# Patient Record
Sex: Male | Born: 1975 | Race: White | Hispanic: No | Marital: Married
Health system: Southern US, Community
[De-identification: ages and names within clinical notes are randomized; demographics above are authoritative.]

---

## 2007-09-05 ENCOUNTER — Ambulatory Visit: Payer: Self-pay

## 2007-10-19 ENCOUNTER — Ambulatory Visit: Payer: Self-pay | Admitting: Orthopaedic Surgery

## 2007-12-06 ENCOUNTER — Emergency Department: Payer: Self-pay | Admitting: Emergency Medicine
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
## 2010-08-10 ENCOUNTER — Emergency Department: Payer: Self-pay | Admitting: Emergency Medicine

## 2011-10-19 ENCOUNTER — Ambulatory Visit: Payer: Self-pay | Admitting: Internal Medicine

## 2011-10-19 ENCOUNTER — Other Ambulatory Visit: Payer: Self-pay | Admitting: Ophthalmology

## 2012-07-14 ENCOUNTER — Emergency Department: Payer: Self-pay | Admitting: Emergency Medicine

## 2017-04-08 ENCOUNTER — Telehealth: Payer: Self-pay | Admitting: Family Medicine

## 2017-04-08 NOTE — Telephone Encounter (Signed)
Maurine Ministerennis ok'ed to see this paerson as a new patient.  Thanks Fortune Brandsteri

## 2021-05-28 DIAGNOSIS — Z131 Encounter for screening for diabetes mellitus: Secondary | ICD-10-CM | POA: Diagnosis not present

## 2021-05-28 DIAGNOSIS — G4733 Obstructive sleep apnea (adult) (pediatric): Secondary | ICD-10-CM | POA: Diagnosis not present

## 2021-05-28 DIAGNOSIS — R5383 Other fatigue: Secondary | ICD-10-CM | POA: Diagnosis not present

## 2021-05-28 DIAGNOSIS — Z1211 Encounter for screening for malignant neoplasm of colon: Secondary | ICD-10-CM | POA: Diagnosis not present

## 2021-05-28 DIAGNOSIS — N1832 Chronic kidney disease, stage 3b: Secondary | ICD-10-CM | POA: Diagnosis not present

## 2021-06-17 DIAGNOSIS — Z1211 Encounter for screening for malignant neoplasm of colon: Secondary | ICD-10-CM | POA: Diagnosis not present

## 2021-07-19 ENCOUNTER — Telehealth: Payer: BC Managed Care – PPO | Admitting: Emergency Medicine

## 2021-07-19 DIAGNOSIS — U071 COVID-19: Secondary | ICD-10-CM | POA: Diagnosis not present

## 2021-07-19 NOTE — Patient Instructions (Addendum)
Francis Johnson, thank you for joining Cathlyn Parsons, NP for today's virtual visit.  While this provider is not your primary care provider (PCP), if your PCP is located in our provider database this encounter information will be shared with them immediately following your visit.  Consent: (Patient) Francis Johnson provided verbal consent for this virtual visit at the beginning of the encounter.  Current Medications: No current outpatient medications on file.   Medications ordered in this encounter:  No orders of the defined types were placed in this encounter.    *If you need refills on other medications prior to your next appointment, please contact your pharmacy*  Follow-Up: Call back or seek an in-person evaluation if the symptoms worsen or if the condition fails to improve as anticipated.  Other Instructions Please keep well-hydrated and get plenty of rest. Start a saline nasal rinse to flush out your nasal passages. You can use plain Mucinex (generic guaifenesin is fine to use) to help thin congestion. Continue using Sudafed in the morning as you have been doing.  Continue using ibuprofen for aches and fever as you have been doing. Use Flonase nasal spray, 2 sprays in each nostril once a day, as directed on the bottle.  You need to quarantine for a full 10 days from your first onset of symptoms, since you are still experiencing symptoms.  If you note any worsening of symptoms, any significant shortness of breath or any chest pain, please seek ER evaluation ASAP.  Please do not delay care!  COVID-19: What to Do if You Are Sick If you test positive and are an older adult or someone who is at high risk of getting very sick from COVID-19, treatment may be available. Contact a healthcare provider right away after a positive test to determine if you are eligible, even if your symptoms are mild right now. You can also visit a Test to Treat location and, if eligible, receive a  prescription from a provider. Don't delay: Treatment must be started within the first few days to be effective. If you have a fever, cough, or other symptoms, you might have COVID-19. Most people have mild illness and are able to recover at home. If you are sick: Keep track of your symptoms. If you have an emergency warning sign (including trouble breathing), call 911. Steps to help prevent the spread of COVID-19 if you are sick If you are sick with COVID-19 or think you might have COVID-19, follow the steps below to care for yourself and to help protect other people in your home and community. Stay home except to get medical care Stay home. Most people with COVID-19 have mild illness and can recover at home without medical care. Do not leave your home, except to get medical care. Do not visit public areas and do not go to places where you are unable to wear a mask. Take care of yourself. Get rest and stay hydrated. Take over-the-counter medicines, such as acetaminophen, to help you feel better. Stay in touch with your doctor. Call before you get medical care. Be sure to get care if you have trouble breathing, or have any other emergency warning signs, or if you think it is an emergency. Avoid public transportation, ride-sharing, or taxis if possible. Get tested If you have symptoms of COVID-19, get tested. While waiting for test results, stay away from others, including staying apart from those living in your household. Get tested as soon as possible after your symptoms start.  Treatments may be available for people with COVID-19 who are at risk for becoming very sick. Don't delay: Treatment must be started early to be effective--some treatments must begin within 5 days of your first symptoms. Contact your healthcare provider right away if your test result is positive to determine if you are eligible. Self-tests are one of several options for testing for the virus that causes COVID-19 and may be more  convenient than laboratory-based tests and point-of-care tests. Ask your healthcare provider or your local health department if you need help interpreting your test results. You can visit your state, tribal, local, and territorial health department's website to look for the latest local information on testing sites. Separate yourself from other people As much as possible, stay in a specific room and away from other people and pets in your home. If possible, you should use a separate bathroom. If you need to be around other people or animals in or outside of the home, wear a well-fitting mask. Tell your close contacts that they may have been exposed to COVID-19. An infected person can spread COVID-19 starting 48 hours (or 2 days) before the person has any symptoms or tests positive. By letting your close contacts know they may have been exposed to COVID-19, you are helping to protect everyone. See COVID-19 and Animals if you have questions about pets. If you are diagnosed with COVID-19, someone from the health department may call you. Answer the call to slow the spread. Monitor your symptoms Symptoms of COVID-19 include fever, cough, or other symptoms. Follow care instructions from your healthcare provider and local health department. Your local health authorities may give instructions on checking your symptoms and reporting information. When to seek emergency medical attention Look for emergency warning signs* for COVID-19. If someone is showing any of these signs, seek emergency medical care immediately: Trouble breathing Persistent pain or pressure in the chest New confusion Inability to wake or stay awake Pale, gray, or blue-colored skin, lips, or nail beds, depending on skin tone *This list is not all possible symptoms. Please call your medical provider for any other symptoms that are severe or concerning to you. Call 911 or call ahead to your local emergency facility: Notify the operator that  you are seeking care for someone who has or may have COVID-19. Call ahead before visiting your doctor Call ahead. Many medical visits for routine care are being postponed or done by phone or telemedicine. If you have a medical appointment that cannot be postponed, call your doctor's office, and tell them you have or may have COVID-19. This will help the office protect themselves and other patients. If you are sick, wear a well-fitting mask You should wear a mask if you must be around other people or animals, including pets (even at home). Wear a mask with the best fit, protection, and comfort for you. You don't need to wear the mask if you are alone. If you can't put on a mask (because of trouble breathing, for example), cover your coughs and sneezes in some other way. Try to stay at least 6 feet away from other people. This will help protect the people around you. Masks should not be placed on young children under age 21 years, anyone who has trouble breathing, or anyone who is not able to remove the mask without help. Cover your coughs and sneezes Cover your mouth and nose with a tissue when you cough or sneeze. Throw away used tissues in a lined trash can.  Immediately wash your hands with soap and water for at least 20 seconds. If soap and water are not available, clean your hands with an alcohol-based hand sanitizer that contains at least 60% alcohol. Clean your hands often Wash your hands often with soap and water for at least 20 seconds. This is especially important after blowing your nose, coughing, or sneezing; going to the bathroom; and before eating or preparing food. Use hand sanitizer if soap and water are not available. Use an alcohol-based hand sanitizer with at least 60% alcohol, covering all surfaces of your hands and rubbing them together until they feel dry. Soap and water are the best option, especially if hands are visibly dirty. Avoid touching your eyes, nose, and mouth with  unwashed hands. Handwashing Tips Avoid sharing personal household items Do not share dishes, drinking glasses, cups, eating utensils, towels, or bedding with other people in your home. Wash these items thoroughly after using them with soap and water or put in the dishwasher. Clean surfaces in your home regularly Clean and disinfect high-touch surfaces (for example, doorknobs, tables, handles, light switches, and countertops) in your "sick room" and bathroom. In shared spaces, you should clean and disinfect surfaces and items after each use by the person who is ill. If you are sick and cannot clean, a caregiver or other person should only clean and disinfect the area around you (such as your bedroom and bathroom) on an as needed basis. Your caregiver/other person should wait as long as possible (at least several hours) and wear a mask before entering, cleaning, and disinfecting shared spaces that you use. Clean and disinfect areas that may have blood, stool, or body fluids on them. Use household cleaners and disinfectants. Clean visible dirty surfaces with household cleaners containing soap or detergent. Then, use a household disinfectant. Use a product from [employer] List N: Disinfectants for Coronavirus (COVID-19). Be sure to follow the instructions on the label to ensure safe and effective use of the product. Many products recommend keeping the surface wet with a disinfectant for a certain period of time (look at "contact time" on the product label). You may also need to wear personal protective equipment, such as gloves, depending on the directions on the product label. Immediately after disinfecting, wash your hands with soap and water for 20 seconds. For completed guidance on cleaning and disinfecting your home, visit Complete Disinfection Guidance. Take steps to improve ventilation at home Improve ventilation (air flow) at home to help prevent from spreading COVID-19 to other people in your  household. Clear out COVID-19 virus particles in the air by opening windows, using air filters, and turning on fans in your home. Use this interactive tool to learn how to improve air flow in your home. When you can be around others after being sick with COVID-19 Deciding when you can be around others is different for different situations. Find out when you can safely end home isolation. For any additional questions about your care, contact your healthcare provider or state or local health department. 01/20/2021 Content source: [facility] ([facility]), Division of Viral Diseases This information is not intended to replace advice given to you by your health care provider. Make sure you discuss any questions you have with your health care provider. Document Revised: 03/05/2021 Document Reviewed: 03/05/2021 Elsevier Patient Education  2022 [employer].      If you have been instructed to have an in-person evaluation today at a local Urgent Care facility, please  use the link below. It will take you to a list of all of our available [facility], including address, phone number and hours of operation. Please do not delay care.  [facility]  If you or a family member do not have a primary care provider, use the link below to schedule a visit and establish care. When you choose a [facility] primary care physician or advanced practice provider, you gain a long-term partner in health. Find a Primary Care Provider  Learn more about [facility]'s in-office and virtual care options: [facility]

## 2021-07-19 NOTE — Progress Notes (Signed)
Virtual Visit Consent   Francis Johnson, you are scheduled for a virtual visit with a [facility] provider today.     Just as with appointments in the office, your consent must be obtained to participate.  Your consent will be active for this visit and any virtual visit you may have with one of our providers in the next 365 days.     If you have a MyChart account, a copy of this consent can be sent to you electronically.  All virtual visits are billed to your insurance company just like a traditional visit in the office.    As this is a virtual visit, video technology does not allow for your provider to perform a traditional examination.  This may limit your provider's ability to fully assess your condition.  If your provider identifies any concerns that need to be evaluated in person or the need to arrange testing (such as labs, EKG, etc.), we will make arrangements to do so.     Although advances in technology are sophisticated, we cannot ensure that it will always work on either your end or our end.  If the connection with a video visit is poor, the visit may have to be switched to a telephone visit.  With either a video or telephone visit, we are not always able to ensure that we have a secure connection.     I need to obtain your verbal consent now.   Are you willing to proceed with your visit today?    Francis Johnson has provided verbal consent on 07/19/2021 for a virtual visit (video or telephone).   Cathlyn Parsons, NP   Date: 07/19/2021 10:42 AM   Virtual Visit via Video Note   I, Cathlyn Parsons, connected with  Francis Johnson  ([id number], August 27, 1979) on 07/19/21 at 10:30 AM EDT by a video-enabled telemedicine application and verified that I am speaking with the correct person using two identifiers.  Location: Patient: Virtual Visit Location Patient: Home Provider: Virtual Visit Location Provider: Home Office   I discussed the limitations of evaluation and management by  telemedicine and the availability of in person appointments. The patient expressed understanding and agreed to proceed.    History of Present Illness: Francis Johnson is a 45 y.o. who identifies as a male who was assigned male at birth, and is being seen today for COVID.  Patient began feeling ill on 07/14/2021 feeling tired and achy.  Tested positive for COVID with a home rapid test.  Was feeling a little bit better 07/17/2021, but now has sore throat, worsening congestion, and still has occasional fever.  Has been using Sudafed in the morning once a day (as it keeps him up if he takes it at night) and ibuprofen 600 mg 3 times daily.  He is drinking lots of water.  He is not feeling short of breath.  His worst symptom is congestion.  HPI: HPI  Problems: There are no problems to display for this patient.   Allergies: No Known Allergies Medications: No current outpatient medications on file.  Observations/Objective: Patient is well-developed, well-nourished in no acute distress.  Resting comfortably  at home.  Head is normocephalic, atraumatic.  No labored breathing.  Speech is clear and coherent with logical content.  Patient is alert and oriented at baseline.    Assessment and Plan: 1. COVID-19  Patient to continue increased fluids, Sudafed, and ibuprofen.  Recommended Mucinex, Flonase nasal spray, and saline irrigation.  Provided  work note.  Reviewed reasons for seeking an elevated level of care.  Follow Up Instructions: I discussed the assessment and treatment plan with the patient. The patient was provided an opportunity to ask questions and all were answered. The patient agreed with the plan and demonstrated an understanding of the instructions.  A copy of instructions were sent to the patient via MyChart.  The patient was advised to call back or seek an in-person evaluation if the symptoms worsen or if the condition fails to improve as anticipated.  Time:  I spent 10 minutes  with the patient via telehealth technology discussing the above problems/concerns.    Cathlyn Parsons, NP

## 2021-11-30 DIAGNOSIS — N1832 Chronic kidney disease, stage 3b: Secondary | ICD-10-CM | POA: Diagnosis not present

## 2021-12-16 DIAGNOSIS — M7552 Bursitis of left shoulder: Secondary | ICD-10-CM | POA: Diagnosis not present

## 2021-12-16 DIAGNOSIS — M25512 Pain in left shoulder: Secondary | ICD-10-CM | POA: Diagnosis not present

## 2022-05-31 DIAGNOSIS — G4733 Obstructive sleep apnea (adult) (pediatric): Secondary | ICD-10-CM | POA: Diagnosis not present

## 2022-05-31 DIAGNOSIS — N1831 Chronic kidney disease, stage 3a: Secondary | ICD-10-CM | POA: Diagnosis not present

## 2022-05-31 DIAGNOSIS — Z0000 Encounter for general adult medical examination without abnormal findings: Secondary | ICD-10-CM | POA: Diagnosis not present

## 2022-06-02 DIAGNOSIS — Z131 Encounter for screening for diabetes mellitus: Secondary | ICD-10-CM | POA: Diagnosis not present

## 2022-06-02 DIAGNOSIS — Z13220 Encounter for screening for lipoid disorders: Secondary | ICD-10-CM | POA: Diagnosis not present

## 2022-06-02 DIAGNOSIS — N1831 Chronic kidney disease, stage 3a: Secondary | ICD-10-CM | POA: Diagnosis not present

## 2022-06-02 DIAGNOSIS — Z0000 Encounter for general adult medical examination without abnormal findings: Secondary | ICD-10-CM | POA: Diagnosis not present

## 2023-05-19 ENCOUNTER — Encounter: Payer: Self-pay | Admitting: *Deleted

## 2023-05-20 ENCOUNTER — Encounter: Admission: RE | Payer: Self-pay | Source: Home / Self Care

## 2023-05-20 ENCOUNTER — Ambulatory Visit: Admission: RE | Admit: 2023-05-20 | Payer: BC Managed Care – PPO | Source: Home / Self Care

## 2023-05-20 SURGERY — COLONOSCOPY WITH PROPOFOL
Anesthesia: General

## 2023-05-30 ENCOUNTER — Ambulatory Visit
Admission: RE | Admit: 2023-05-30 | Discharge: 2023-05-30 | Disposition: A | Discharge status: Home / Self Care | Payer: BC Managed Care – PPO | Source: Home / Self Care | Attending: Gastroenterology | Admitting: Gastroenterology

## 2023-05-30 ENCOUNTER — Ambulatory Visit: Payer: BC Managed Care – PPO

## 2023-05-30 ENCOUNTER — Encounter
Admission: RE | Disposition: A | Discharge status: Home / Self Care | Payer: Self-pay | Source: Home / Self Care | Attending: Gastroenterology

## 2023-05-30 DIAGNOSIS — F17200 Nicotine dependence, unspecified, uncomplicated: Secondary | ICD-10-CM | POA: Insufficient documentation

## 2023-05-30 DIAGNOSIS — Z1211 Encounter for screening for malignant neoplasm of colon: Secondary | ICD-10-CM | POA: Diagnosis present

## 2023-05-30 DIAGNOSIS — Z6832 Body mass index (BMI) 32.0-32.9, adult: Secondary | ICD-10-CM | POA: Insufficient documentation

## 2023-05-30 DIAGNOSIS — J449 Chronic obstructive pulmonary disease, unspecified: Secondary | ICD-10-CM | POA: Diagnosis not present

## 2023-05-30 DIAGNOSIS — K640 First degree hemorrhoids: Secondary | ICD-10-CM | POA: Diagnosis not present

## 2023-05-30 HISTORY — PX: COLONOSCOPY WITH PROPOFOL: SHX5780

## 2023-05-30 SURGERY — COLONOSCOPY WITH PROPOFOL
Anesthesia: General

## 2023-05-30 MED ORDER — SODIUM CHLORIDE 0.9 % IV SOLN: INTRAVENOUS | Status: DC

## 2023-05-30 MED ORDER — PROPOFOL 10 MG/ML IV BOLUS
INTRAVENOUS | Status: DC | PRN
Start: 2023-05-30 — End: 2023-05-30
  Administered 2023-05-30: 50 mg via INTRAVENOUS
  Administered 2023-05-30: 100 mg via INTRAVENOUS
  Administered 2023-05-30: 50 mg via INTRAVENOUS

## 2023-05-30 MED ORDER — PROPOFOL 10 MG/ML IV BOLUS
INTRAVENOUS | Status: AC
  Filled 2023-05-30: qty 40

## 2023-05-30 MED ORDER — STERILE WATER FOR IRRIGATION IR SOLN
Status: DC | PRN
  Administered 2023-05-30: 60 mL

## 2023-05-30 MED ORDER — PROPOFOL 500 MG/50ML IV EMUL
INTRAVENOUS | Status: DC | PRN
  Administered 2023-05-30: 150 ug/kg/min via INTRAVENOUS

## 2023-05-30 NOTE — Anesthesia Preprocedure Evaluation (Signed)
Anesthesia Evaluation  Patient identified by MRN, date of birth, ID band Patient awake    Reviewed: Allergy & Precautions, NPO status , Patient's Chart, lab work & pertinent test results  Airway Mallampati: II  TM Distance: >3 FB Neck ROM: full    Dental  (+) Teeth Intact   Pulmonary neg pulmonary ROS, COPD, Current Smoker and Patient abstained from smoking.   Pulmonary exam normal  + decreased breath sounds      Cardiovascular Exercise Tolerance: Good negative cardio ROS Normal cardiovascular exam Rhythm:Regular Rate:Normal     Neuro/Psych negative neurological ROS  negative psych ROS   GI/Hepatic negative GI ROS, Neg liver ROS,,,  Endo/Other  negative endocrine ROS  Morbid obesity  Renal/GU negative Renal ROS  negative genitourinary   Musculoskeletal   Abdominal  (+) + obese  Peds negative pediatric ROS (+)  Hematology negative hematology ROS (+)   Anesthesia Other Findings History reviewed. No pertinent past medical history.  History reviewed. No pertinent surgical history.  BMI    Body Mass Index: 32.28 kg/m      Reproductive/Obstetrics negative OB ROS                             Anesthesia Physical Anesthesia Plan  ASA: 2  Anesthesia Plan: General   Post-op Pain Management:    Induction: Intravenous  PONV Risk Score and Plan: Propofol infusion and TIVA  Airway Management Planned: Natural Airway  Additional Equipment:   Intra-op Plan:   Post-operative Plan:   Informed Consent: I have reviewed the patients History and Physical, chart, labs and discussed the procedure including the risks, benefits and alternatives for the proposed anesthesia with the patient or authorized representative who has indicated his/her understanding and acceptance.     Dental Advisory Given  Plan Discussed with: CRNA and Surgeon  Anesthesia Plan Comments:        Anesthesia  Quick Evaluation

## 2023-05-30 NOTE — Anesthesia Postprocedure Evaluation (Signed)
Anesthesia Post Note  Patient: Francis Johnson  Procedure(s) Performed: COLONOSCOPY WITH PROPOFOL  Patient location during evaluation: PACU Anesthesia Type: General Level of consciousness: awake and awake and alert Pain management: satisfactory to patient Vital Signs Assessment: post-procedure vital signs reviewed and stable Respiratory status: spontaneous breathing and nonlabored ventilation Cardiovascular status: stable Anesthetic complications: no   No notable events documented.   Last Vitals:  Vitals:   05/30/23 1106 05/30/23 1115  BP: 100/61 (!) 131/92  Pulse: 71 66  Resp: 16 14  Temp: (!) 36.1 C   SpO2: 99% 99%    Last Pain:  Vitals:   05/30/23 1106  TempSrc: Temporal  PainSc:                  VAN STAVEREN,Lashena Signer

## 2023-05-30 NOTE — Interval H&P Note (Signed)
History and Physical Interval Note:  05/30/2023 10:32 AM  Francis Johnson  has presented today for surgery, with the diagnosis of Colon Cancer Screening.  The various methods of treatment have been discussed with the patient and family. After consideration of risks, benefits and other options for treatment, the patient has consented to  Procedure(s): COLONOSCOPY WITH PROPOFOL (N/A) as a surgical intervention.  The patient's history has been reviewed, patient examined, no change in status, stable for surgery.  I have reviewed the patient's chart and labs.  Questions were answered to the patient's satisfaction.     Regis Bill  Ok to proceed with colonoscopy

## 2023-05-30 NOTE — Op Note (Signed)
[facility]enter[facility] Patient Name: Francis Johnson Procedure Date: 05/30/2023 10:15 AM MRN: [id number] Account #: [id number] Date of Birth: [birth year] Admit Type: Outpatient Age: 47 Room: [facility] ENDO ROOM 3 Gender: Male Note Status: Finalized Instrument Name: Prentice Docker [id number] Procedure:             Colonoscopy Indications:           Screening for colorectal malignant neoplasm Providers:             Eather Colas MD, MD Medicines:             Monitored Anesthesia Care Complications:         No immediate complications. Procedure:             Pre-Anesthesia Assessment:                        - Prior to the procedure, a History and Physical was                         performed, and patient medications and allergies were                         reviewed. The patient is competent. The risks and                         benefits of the procedure and the sedation options and                         risks were discussed with the patient. All questions                         were answered and informed consent was obtained.                         Patient identification and proposed procedure were                         verified by the physician, the nurse, the                         anesthesiologist, the anesthetist and the technician                         in the endoscopy suite. Mental Status Examination:                         alert and oriented. Airway Examination: normal                         oropharyngeal airway and neck mobility. Respiratory                         Examination: clear to auscultation. CV Examination:                         normal. Prophylactic Antibiotics: The patient does not                         require prophylactic antibiotics. Prior  Anticoagulants: The patient has taken no anticoagulant                         or antiplatelet agents. ASA Grade Assessment: II - A                         patient with mild  systemic disease. After reviewing                         the risks and benefits, the patient was deemed in                         satisfactory condition to undergo the procedure. The                         anesthesia plan was to use monitored anesthesia care                         (MAC). Immediately prior to administration of                         medications, the patient was re-assessed for adequacy                         to receive sedatives. The heart rate, respiratory                         rate, oxygen saturations, blood pressure, adequacy of                         pulmonary ventilation, and response to care were                         monitored throughout the procedure. The physical                         status of the patient was re-assessed after the                         procedure.                        After obtaining informed consent, the colonoscope was                         passed under direct vision. Throughout the procedure,                         the patient's blood pressure, pulse, and oxygen                         saturations were monitored continuously. The                         Colonoscope was introduced through the anus and                         advanced to the the cecum, identified by appendiceal  orifice and ileocecal valve. The colonoscopy was                         performed without difficulty. The patient tolerated                         the procedure well. The quality of the bowel                         preparation was good. The ileocecal valve, appendiceal                         orifice, and rectum were photographed. Findings:      The perianal and digital rectal examinations were normal.      Internal hemorrhoids were found during retroflexion. The hemorrhoids       were Grade I (internal hemorrhoids that do not prolapse).      The exam was otherwise without abnormality on direct and retroflexion        views. Impression:            - Internal hemorrhoids.                        - The examination was otherwise normal on direct and                         retroflexion views.                        - No specimens collected. Recommendation:        - Discharge patient to home.                        - Resume previous diet.                        - Continue present medications.                        - Repeat colonoscopy in 10 years for screening                         purposes.                        - Return to referring physician as previously                         scheduled. Procedure Code(s):     --- Professional ---                        X3244, Colorectal cancer screening; colonoscopy on                         individual not meeting criteria for high risk Diagnosis Code(s):     --- Professional ---                        Z12.11, Encounter for screening for malignant neoplasm  of colon                        K64.0, First degree hemorrhoids CPT copyright 2022 [employer]. All rights reserved. The codes documented in this report are preliminary and upon coder review may  be revised to meet current compliance requirements. Eather Colas MD, MD 05/30/2023 10:55:06 AM Number of Addenda: 0 Note Initiated On: 05/30/2023 10:15 AM Scope Withdrawal Time: 0 hours 6 minutes 29 seconds  Total Procedure Duration: 0 hours 11 minutes 16 seconds  Estimated Blood Loss:  Estimated blood loss: none.      [facility]

## 2023-05-30 NOTE — H&P (Signed)
Outpatient short stay form Pre-procedure 05/30/2023  Regis Bill, MD  Primary Physician: Lauro Regulus, MD  Reason for visit:  Screening  History of present illness:    47 y/o gentleman here for index screening colonoscopy. No significant abdominal surgeries. No family history of GI malignancies. No blood thinners.    Current Facility-Administered Medications:    0.9 %  sodium chloride infusion, , Intravenous, Continuous, Adonys Wildes, Rossie Muskrat, MD, Last Rate: 20 mL/hr at 05/30/23 1006, New Bag at 05/30/23 1006  No medications prior to admission.     No Known Allergies   History reviewed. No pertinent past medical history.  Review of systems:  Otherwise negative.    Physical Exam  Gen: Alert, oriented. Appears stated age.  HEENT: PERRLA. Lungs: No respiratory distress CV: RRR Abd: soft, benign, no masses Ext: No edema    Planned procedures: Proceed with colonoscopy. The patient understands the nature of the planned procedure, indications, risks, alternatives and potential complications including but not limited to bleeding, infection, perforation, damage to internal organs and possible oversedation/side effects from anesthesia. The patient agrees and gives consent to proceed.  Please refer to procedure notes for findings, recommendations and patient disposition/instructions.     Regis Bill, MD [facility]enterology

## 2023-05-30 NOTE — Transfer of Care (Signed)
Immediate Anesthesia Transfer of Care Note  Patient: Francis Johnson  Procedure(s) Performed: COLONOSCOPY WITH PROPOFOL  Patient Location: PACU  Anesthesia Type:MAC  Level of Consciousness: awake  Airway & Oxygen Therapy: Patient Spontanous Breathing  Post-op Assessment: Report given to RN and Post -op Vital signs reviewed and stable  Post vital signs: Reviewed  Last Vitals:  Vitals Value Taken Time  BP 102/74 05/30/23 1056  Temp    Pulse 75 05/30/23 1056  Resp 18 05/30/23 1056  SpO2 97 % 05/30/23 1056    Last Pain:  Vitals:   05/30/23 1056  TempSrc:   PainSc: Asleep         Complications: No notable events documented.

## 2023-05-31 ENCOUNTER — Encounter: Payer: Self-pay | Admitting: Gastroenterology
